# Patient Record
Sex: Female | Born: 1990 | Race: White | Hispanic: Yes | Smoking: Former smoker
Health system: Southern US, Community
[De-identification: ages and names within clinical notes are randomized; demographics above are authoritative.]

## PROBLEM LIST (undated history)

## (undated) DIAGNOSIS — N289 Disorder of kidney and ureter, unspecified: Secondary | ICD-10-CM

## (undated) DIAGNOSIS — Z87442 Personal history of urinary calculi: Secondary | ICD-10-CM

## (undated) HISTORY — PX: WISDOM TOOTH EXTRACTION: SHX21

---
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
## 2012-09-06 ENCOUNTER — Emergency Department: Payer: Self-pay | Admitting: Internal Medicine

## 2012-09-08 ENCOUNTER — Emergency Department: Payer: Self-pay | Admitting: Emergency Medicine

## 2012-09-11 ENCOUNTER — Emergency Department: Payer: Self-pay | Admitting: Emergency Medicine

## 2012-10-30 ENCOUNTER — Emergency Department: Payer: Self-pay | Admitting: Unknown Physician Specialty

## 2014-05-14 ENCOUNTER — Emergency Department: Payer: Self-pay | Admitting: Emergency Medicine

## 2014-12-14 ENCOUNTER — Emergency Department: Payer: Self-pay

## 2014-12-14 ENCOUNTER — Emergency Department
Admission: EM | Admit: 2014-12-14 | Discharge: 2014-12-14 | Disposition: A | Discharge status: Home / Self Care | Payer: Self-pay | Attending: Emergency Medicine | Admitting: Emergency Medicine

## 2014-12-14 ENCOUNTER — Encounter: Payer: Self-pay | Admitting: *Deleted

## 2014-12-14 DIAGNOSIS — R109 Unspecified abdominal pain: Secondary | ICD-10-CM

## 2014-12-14 DIAGNOSIS — N201 Calculus of ureter: Secondary | ICD-10-CM | POA: Insufficient documentation

## 2014-12-14 DIAGNOSIS — Z3202 Encounter for pregnancy test, result negative: Secondary | ICD-10-CM | POA: Insufficient documentation

## 2014-12-14 DIAGNOSIS — Z720 Tobacco use: Secondary | ICD-10-CM | POA: Insufficient documentation

## 2014-12-14 LAB — CBC WITH DIFFERENTIAL/PLATELET
Basophils Absolute: 0.1 10*3/uL (ref 0–0.1)
Basophils Relative: 1 %
EOS ABS: 0.5 10*3/uL (ref 0–0.7)
EOS PCT: 3 %
HCT: 38.3 % (ref 35.0–47.0)
HEMOGLOBIN: 13 g/dL (ref 12.0–16.0)
LYMPHS ABS: 2.3 10*3/uL (ref 1.0–3.6)
LYMPHS PCT: 14 %
MCH: 29.7 pg (ref 26.0–34.0)
MCHC: 33.8 g/dL (ref 32.0–36.0)
MCV: 87.9 fL (ref 80.0–100.0)
MONO ABS: 0.9 10*3/uL (ref 0.2–0.9)
MONOS PCT: 5 %
NEUTROS ABS: 13.2 10*3/uL — AB (ref 1.4–6.5)
Neutrophils Relative %: 77 %
Platelets: 265 10*3/uL (ref 150–440)
RBC: 4.36 MIL/uL (ref 3.80–5.20)
RDW: 13.2 % (ref 11.5–14.5)
WBC: 17.1 10*3/uL — ABNORMAL HIGH (ref 3.6–11.0)

## 2014-12-14 LAB — COMPREHENSIVE METABOLIC PANEL
ALT: 26 U/L (ref 14–54)
ANION GAP: 13 (ref 5–15)
AST: 31 U/L (ref 15–41)
Albumin: 4 g/dL (ref 3.5–5.0)
Alkaline Phosphatase: 63 U/L (ref 38–126)
BILIRUBIN TOTAL: 0.7 mg/dL (ref 0.3–1.2)
BUN: 9 mg/dL (ref 6–20)
CO2: 22 mmol/L (ref 22–32)
Calcium: 8.8 mg/dL — ABNORMAL LOW (ref 8.9–10.3)
Chloride: 100 mmol/L — ABNORMAL LOW (ref 101–111)
Creatinine, Ser: 0.89 mg/dL (ref 0.44–1.00)
GFR calc Af Amer: >60 mL/min (ref >60)
GFR calc non Af Amer: >60 mL/min (ref >60)
GLUCOSE: 116 mg/dL — AB (ref 65–99)
Potassium: 3.8 mmol/L (ref 3.5–5.1)
SODIUM: 135 mmol/L (ref 135–145)
TOTAL PROTEIN: 7.4 g/dL (ref 6.5–8.1)

## 2014-12-14 LAB — URINALYSIS COMPLETE WITH MICROSCOPIC (ARMC ONLY)
Bacteria, UA: NONE SEEN
Bilirubin Urine: NEGATIVE
GLUCOSE, UA: NEGATIVE mg/dL
KETONES UR: NEGATIVE mg/dL
Leukocytes, UA: NEGATIVE
Nitrite: NEGATIVE
PH: 5 (ref 5.0–8.0)
PROTEIN: NEGATIVE mg/dL
SPECIFIC GRAVITY, URINE: 1.016 (ref 1.005–1.030)

## 2014-12-14 LAB — POCT PREGNANCY, URINE: PREG TEST UR: NEGATIVE

## 2014-12-14 MED ORDER — MORPHINE SULFATE 4 MG/ML IJ SOLN: 4.0000 mg | Freq: Once | INTRAMUSCULAR | Status: DC

## 2014-12-14 MED ORDER — ONDANSETRON HCL 4 MG/2ML IJ SOLN
4.0000 mg | Freq: Once | INTRAMUSCULAR | Status: AC
  Administered 2014-12-14: 4 mg via INTRAVENOUS
  Filled 2014-12-14: qty 2

## 2014-12-14 MED ORDER — MORPHINE SULFATE 4 MG/ML IJ SOLN
4.0000 mg | Freq: Once | INTRAMUSCULAR | Status: AC
Start: 2014-12-14 — End: 2014-12-14
  Administered 2014-12-14: 4 mg via INTRAVENOUS
  Filled 2014-12-14: qty 1

## 2014-12-14 MED ORDER — TAMSULOSIN HCL 0.4 MG PO CAPS
0.4000 mg | ORAL_CAPSULE | Freq: Once | ORAL | Status: AC
  Administered 2014-12-14: 0.4 mg via ORAL
  Filled 2014-12-14: qty 1

## 2014-12-14 MED ORDER — TAMSULOSIN HCL 0.4 MG PO CAPS: 0.4000 mg | ORAL_CAPSULE | Freq: Every day | ORAL | Status: DC

## 2014-12-14 MED ORDER — KETOROLAC TROMETHAMINE 30 MG/ML IJ SOLN
INTRAMUSCULAR | Status: AC
  Filled 2014-12-14: qty 1

## 2014-12-14 MED ORDER — SODIUM CHLORIDE 0.9 % IV BOLUS (SEPSIS)
1000.0000 mL | Freq: Once | INTRAVENOUS | Status: AC
  Administered 2014-12-14: 1000 mL via INTRAVENOUS

## 2014-12-14 MED ORDER — OXYCODONE-ACETAMINOPHEN 5-325 MG PO TABS: 1.0000 | ORAL_TABLET | Freq: Four times a day (QID) | ORAL | Status: DC | PRN

## 2014-12-14 MED ORDER — KETOROLAC TROMETHAMINE 30 MG/ML IJ SOLN
30.0000 mg | Freq: Once | INTRAMUSCULAR | Status: AC
  Administered 2014-12-14: 30 mg via INTRAVENOUS
  Filled 2014-12-14: qty 1

## 2014-12-14 NOTE — ED Notes (Signed)
Pt has right flank pain since 1700 today.  No n/v/d.  No hx of kidney stones.

## 2014-12-14 NOTE — ED Provider Notes (Signed)
[facility] Emergency Department Provider Note  ____________________________________________  Time seen: Approximately 710 PM  I have reviewed the triage vital signs and the nursing notes.   HISTORY  Chief Complaint Flank Pain    HPI Rachael Mendez is a 24 y.o. female with a history of chlamydia as well as UTI presents today with 1 day of dark urine, urinary frequency as well as right flank pain. She denies any history of kidney stones or known history in her family. Is also concerned about STDs. Has a history of chlamydia and was treated in the past. Denies any vaginal bleeding or discharge at this time. Says also with headache which is moderate and diffuse.Gradual onset over the last 3-4 days. Also suprapubic abdominal pain. Nauseous but no vomiting.   No past medical history on file.  There are no active problems to display for this patient.   No past surgical history on file.  No current outpatient prescriptions on file.  Allergies Review of patient's allergies indicates no known allergies.  No family history on file.  Social History History  Substance Use Topics  . Smoking status: Current Every Day Smoker  . Smokeless tobacco: Not on file  . Alcohol Use: No    Review of Systems Constitutional: No fever/chills Eyes: No visual changes. ENT: No sore throat. Cardiovascular: Denies chest pain. Respiratory: Denies shortness of breath. Gastrointestinal:  no vomiting.  No diarrhea.  No constipation. Genitourinary: As above  Musculoskeletal: Negative for back pain. Skin: Negative for rash. Neurological: Negative for focal weakness or numbness.  10-point ROS otherwise negative.  ____________________________________________   PHYSICAL EXAM:  VITAL SIGNS: ED Triage Vitals  Enc Vitals Group     BP 12/14/14 1839 112/61 mmHg     Pulse Rate 12/14/14 1839 60     Resp 12/14/14 1839 20     Temp 12/14/14 1839 98.1 F (36.7 C)     Temp Source  12/14/14 1839 Oral     SpO2 12/14/14 1842 97 %     Weight 12/14/14 1839 154 lb (69.854 kg)     Height 12/14/14 1839  (1.549 m)     Head Cir --      Peak Flow --      Pain Score 12/14/14 1841 10     Pain Loc --      Pain Edu? --      Excl. in GC? --     Constitutional: Alert and oriented. Well appearing and in no acute distress. Eyes: Conjunctivae are normal. PERRL. EOMI. Head: Atraumatic. Nose: No congestion/rhinnorhea. Mouth/Throat: Mucous membranes are moist.  Oropharynx non-erythematous. Neck: No stridor.   Cardiovascular: Normal rate, regular rhythm. Grossly normal heart sounds.  Good peripheral circulation. Respiratory: Normal respiratory effort.  No retractions. Lungs CTAB. Gastrointestinal: Soft with mild suprapubic tenderness. No distention. No abdominal bruits. Right CVA tenderness to palpation.  Musculoskeletal: No lower extremity tenderness nor edema.  No joint effusions. Neurologic:  Normal speech and language. No gross focal neurologic deficits are appreciated. No gait instability. Skin:  Skin is warm, dry and intact. No rash noted. Psychiatric: Mood and affect are normal. Speech and behavior are normal.  ____________________________________________   LABS (all labs ordered are listed, but only abnormal results are displayed)  Labs Reviewed  URINALYSIS COMPLETEWITH MICROSCOPIC ([facility] ONLY) - Abnormal; Notable for the following:    Color, Urine YELLOW (*)    APPearance CLEAR (*)    Hgb urine dipstick 1+ (*)    Squamous Epithelial /  LPF 6-30 (*)    All other components within normal limits  CBC WITH DIFFERENTIAL/PLATELET - Abnormal; Notable for the following:    WBC 17.1 (*)    Neutro Abs 13.2 (*)    All other components within normal limits  COMPREHENSIVE METABOLIC PANEL - Abnormal; Notable for the following:    Chloride 100 (*)    Glucose, Bld 116 (*)    Calcium 8.8 (*)    All other components within normal limits  WET PREP, GENITAL  CHLAMYDIA/NGC RT  PCR ([facility] ONLY)  URINE CULTURE  POCT PREGNANCY, URINE   ____________________________________________  EKG   ____________________________________________  RADIOLOGY  2 mm calculus in the right UVJ with moderate Hydro. Nonobstructing calculi in the mid right kidney. ____________________________________________   PROCEDURES   ____________________________________________   INITIAL IMPRESSION / ASSESSMENT AND PLAN / ED COURSE  Pertinent labs & imaging results that were available during my care of the patient were reviewed by me and considered in my medical decision making (see chart for details).  ----------------------------------------- 10:19 PM on 12/14/2014 -----------------------------------------  Patient dosed morphine and then Toradol because of pain not being relieved with morphine. Patient now resting comfortably. We'll discharge to home. Will follow up with urology. We'll give urine strainer for home use.  Discussed case with Dr.Escridge of urology who feels that the patient is appropriate for home as long as the pain is controlled. Reviewed the images as well as lab results.  Patient wanting to defer pelvic exam. I agree with this as the patient has been in discomfort and is likely secondary to the kidney stone which was found on the CAT scan. Will follow-up with her primary care doctor for further evaluation for sexually transmitted diseases. ____________________________________________   FINAL CLINICAL IMPRESSION(S) / ED DIAGNOSES  Right ureterolithiasis. Initial visit.    Myrna Blazer, MD 12/14/14 2220

## 2014-12-14 NOTE — ED Notes (Signed)
Pt complains of right sided flank pain since this AM, pt states she noticed some dark urine, pt also complains of a headache, pt alert in no distress

## 2014-12-16 LAB — URINE CULTURE

## 2015-04-05 ENCOUNTER — Encounter: Payer: Self-pay | Admitting: Emergency Medicine

## 2015-04-05 ENCOUNTER — Emergency Department
Admission: EM | Admit: 2015-04-05 | Discharge: 2015-04-05 | Disposition: A | Discharge status: Home / Self Care | Payer: Self-pay | Attending: Emergency Medicine | Admitting: Emergency Medicine

## 2015-04-05 DIAGNOSIS — N200 Calculus of kidney: Secondary | ICD-10-CM | POA: Insufficient documentation

## 2015-04-05 DIAGNOSIS — Z3202 Encounter for pregnancy test, result negative: Secondary | ICD-10-CM | POA: Insufficient documentation

## 2015-04-05 DIAGNOSIS — F17200 Nicotine dependence, unspecified, uncomplicated: Secondary | ICD-10-CM | POA: Insufficient documentation

## 2015-04-05 HISTORY — DX: Disorder of kidney and ureter, unspecified: N28.9

## 2015-04-05 LAB — COMPREHENSIVE METABOLIC PANEL
ALBUMIN: 4.4 g/dL (ref 3.5–5.0)
ALT: 17 U/L (ref 14–54)
AST: 20 U/L (ref 15–41)
Alkaline Phosphatase: 65 U/L (ref 38–126)
Anion gap: 8 (ref 5–15)
BUN: 8 mg/dL (ref 6–20)
CHLORIDE: 104 mmol/L (ref 101–111)
CO2: 28 mmol/L (ref 22–32)
CREATININE: 0.82 mg/dL (ref 0.44–1.00)
Calcium: 9.4 mg/dL (ref 8.9–10.3)
GFR calc Af Amer: >60 mL/min (ref >60)
GFR calc non Af Amer: >60 mL/min (ref >60)
GLUCOSE: 105 mg/dL — AB (ref 65–99)
Potassium: 4 mmol/L (ref 3.5–5.1)
SODIUM: 140 mmol/L (ref 135–145)
Total Bilirubin: 0.4 mg/dL (ref 0.3–1.2)
Total Protein: 7.8 g/dL (ref 6.5–8.1)

## 2015-04-05 LAB — CBC WITH DIFFERENTIAL/PLATELET
Basophils Absolute: 0.1 10*3/uL (ref 0–0.1)
Basophils Relative: 1 %
EOS PCT: 1 %
Eosinophils Absolute: 0.2 10*3/uL (ref 0–0.7)
HCT: 40.1 % (ref 35.0–47.0)
Hemoglobin: 13.1 g/dL (ref 12.0–16.0)
LYMPHS ABS: 1.2 10*3/uL (ref 1.0–3.6)
LYMPHS PCT: 7 %
MCH: 28.3 pg (ref 26.0–34.0)
MCHC: 32.7 g/dL (ref 32.0–36.0)
MCV: 86.4 fL (ref 80.0–100.0)
MONOS PCT: 8 %
Monocytes Absolute: 1.3 10*3/uL — ABNORMAL HIGH (ref 0.2–0.9)
Neutro Abs: 14.4 10*3/uL — ABNORMAL HIGH (ref 1.4–6.5)
Neutrophils Relative %: 83 %
Platelets: 211 10*3/uL (ref 150–440)
RBC: 4.64 MIL/uL (ref 3.80–5.20)
RDW: 13.2 % (ref 11.5–14.5)
WBC: 17.2 10*3/uL — ABNORMAL HIGH (ref 3.6–11.0)

## 2015-04-05 LAB — LIPASE, BLOOD: Lipase: 23 U/L (ref 11–51)

## 2015-04-05 LAB — URINALYSIS COMPLETE WITH MICROSCOPIC (ARMC ONLY)
Bacteria, UA: NONE SEEN
Bilirubin Urine: NEGATIVE
GLUCOSE, UA: NEGATIVE mg/dL
NITRITE: NEGATIVE
Protein, ur: 100 mg/dL — AB
SPECIFIC GRAVITY, URINE: 1.019 (ref 1.005–1.030)
pH: 7 (ref 5.0–8.0)

## 2015-04-05 LAB — PREGNANCY, URINE: Preg Test, Ur: NEGATIVE

## 2015-04-05 MED ORDER — SODIUM CHLORIDE 0.9 % IV BOLUS (SEPSIS)
1000.0000 mL | Freq: Once | INTRAVENOUS | Status: AC
  Administered 2015-04-05: 1000 mL via INTRAVENOUS

## 2015-04-05 MED ORDER — OXYCODONE-ACETAMINOPHEN 5-325 MG PO TABS: 1.0000 | ORAL_TABLET | Freq: Four times a day (QID) | ORAL | Status: DC | PRN

## 2015-04-05 MED ORDER — ONDANSETRON HCL 4 MG/2ML IJ SOLN
4.0000 mg | Freq: Once | INTRAMUSCULAR | Status: AC
  Administered 2015-04-05: 4 mg via INTRAVENOUS

## 2015-04-05 MED ORDER — CIPROFLOXACIN HCL 500 MG PO TABS
500.0000 mg | ORAL_TABLET | Freq: Two times a day (BID) | ORAL | Status: AC
Start: 2015-04-05 — End: 2015-04-15

## 2015-04-05 MED ORDER — TAMSULOSIN HCL 0.4 MG PO CAPS: 0.4000 mg | ORAL_CAPSULE | Freq: Every day | ORAL | Status: DC

## 2015-04-05 MED ORDER — MORPHINE SULFATE (PF) 4 MG/ML IV SOLN
INTRAVENOUS | Status: AC
  Filled 2015-04-05: qty 1

## 2015-04-05 MED ORDER — PROMETHAZINE HCL 12.5 MG PO TABS: 12.5000 mg | ORAL_TABLET | Freq: Four times a day (QID) | ORAL | Status: DC | PRN

## 2015-04-05 MED ORDER — MORPHINE SULFATE (PF) 4 MG/ML IV SOLN
4.0000 mg | Freq: Once | INTRAVENOUS | Status: AC
  Administered 2015-04-05: 4 mg via INTRAVENOUS

## 2015-04-05 MED ORDER — KETOROLAC TROMETHAMINE 30 MG/ML IJ SOLN
30.0000 mg | Freq: Once | INTRAMUSCULAR | Status: AC
  Administered 2015-04-05: 30 mg via INTRAVENOUS

## 2015-04-05 MED ORDER — ONDANSETRON HCL 4 MG/2ML IJ SOLN
INTRAMUSCULAR | Status: AC
  Filled 2015-04-05: qty 2

## 2015-04-05 NOTE — ED Provider Notes (Addendum)
[facility] Emergency Department Provider Note  ____________________________________________   I have reviewed the triage vital signs and the nursing notes.   HISTORY  Chief Complaint Flank Pain    HPI Rachael Mendez is a 24 y.o. female patient with a history of kidney stones present with sudden onset right flank pain 2 days ago with attendant vomiting and no fever. No dysuria no urinary frequency. Feels like prior kidney stone.  Past Medical History  Diagnosis Date  . Renal disorder     There are no active problems to display for this patient.   History reviewed. No pertinent past surgical history.  Current Outpatient Rx  Name  Route  Sig  Dispense  Refill  . oxyCODONE-acetaminophen (ROXICET) 5-325 MG per tablet   Oral   Take 1-2 tablets by mouth every 6 (six) hours as needed.   15 tablet   0   . tamsulosin (FLOMAX) 0.4 MG CAPS capsule   Oral   Take 1 capsule (0.4 mg total) by mouth daily.   30 capsule   0     Allergies Review of patient's allergies indicates no known allergies.  No family history on file.  Social History Social History  Substance Use Topics  . Smoking status: Current Every Day Smoker  . Smokeless tobacco: None  . Alcohol Use: No    Review of Systems Constitutional: No fever/chills Eyes: No visual changes. ENT: No sore throat. No stiff neck no neck pain Cardiovascular: Denies chest pain. Respiratory: Denies shortness of breath. Gastrointestinal:   Positive vomiting.  No diarrhea.  No constipation. Genitourinary: Negative for dysuria. Musculoskeletal: Negative lower extremity swelling Skin: Negative for rash. Neurological: Negative for headaches, focal weakness or numbness. 10-point ROS otherwise negative.  ____________________________________________   PHYSICAL EXAM:  VITAL SIGNS: ED Triage Vitals  Enc Vitals Group     BP 04/05/15 1636 115/77 mmHg     Pulse Rate 04/05/15 1636 75     Resp 04/05/15  1636 20     Temp 04/05/15 1636 97.8 F (36.6 C)     Temp src --      SpO2 04/05/15 1636 96 %     Weight 04/05/15 1636 154 lb (69.854 kg)     Height 04/05/15 1636  (1.549 m)     Head Cir --      Peak Flow --      Pain Score 04/05/15 1637 9     Pain Loc --      Pain Edu? --      Excl. in GC? --     Constitutional: Alert and oriented. Well appearing ears uncomfortable and nontoxic Eyes: Conjunctivae are normal. PERRL. EOMI. Head: Atraumatic. Nose: No congestion/rhinnorhea. Mouth/Throat: Mucous membranes are moist.  Oropharynx non-erythematous. Neck: No stridor.   Nontender with no meningismus Cardiovascular: Normal rate, regular rhythm. Grossly normal heart sounds.  Good peripheral circulation. Respiratory: Normal respiratory effort.  No retractions. Lungs CTAB. Abdominal: Soft and nontender. No distention. No guarding no rebound Back:  There is no focal tenderness or step off there is no midline tenderness there are no lesions noted. there is right CVA tenderness Musculoskeletal: No lower extremity tenderness. No joint effusions, no DVT signs strong distal pulses no edema Neurologic:  Normal speech and language. No gross focal neurologic deficits are appreciated.  Skin:  Skin is warm, dry and intact. No rash noted. Psychiatric: Mood and affect are normal. Speech and behavior are normal.  ____________________________________________   LABS (all labs ordered are  listed, but only abnormal results are displayed)  Labs Reviewed  URINE CULTURE  URINALYSIS COMPLETEWITH MICROSCOPIC ([facility] ONLY)  PREGNANCY, URINE  COMPREHENSIVE METABOLIC PANEL  CBC WITH DIFFERENTIAL/PLATELET  LIPASE, BLOOD   ____________________________________________  EKG  I personally interpreted any EKGs ordered by me or triage  ____________________________________________  RADIOLOGY  I reviewed any imaging ordered by me or triage that were performed during my  shift ____________________________________________   PROCEDURES  Procedure(s) performed: None  Critical Care performed: None  ____________________________________________   INITIAL IMPRESSION / ASSESSMENT AND PLAN / ED COURSE  Pertinent labs & imaging results that were available during my care of the patient were reviewed by me and considered in my medical decision making (see chart for details).  Patient with known kidney stones and recent CT scan which showed a kidney stone presents today with sudden onset flank pain and vomiting for 2 days ago. We'll check basic blood work. We'll check urinalysis giving IV fluid and antinausea medicine. We'll give pain medicines as well. We will defer CT scan if possible given radiation burn on this young patient with prior CT scan for known kidney stones.   ----------------------------------------- 7:37 PM on 04/05/2015 -----------------------------------------  Patient feels much better nothing at this time to indicate acute UTI, patient has had no dysuria no urinary frequency. Her white count is up and it was up last time she had a kidney stone and her last CT scan showed that there were more to come. Recent and I discussed the risks benefits alternatives of CT scan and she prefer not to have one. I do not think this is unreasonable given that she has a known history of kidney stones. I will send a urine culture as a precaution however there is no other infectious symptoms noted in patient does have known kidney stones with sudden onset flank pain is certainly consistent with that. However, whenever there is sufficient blood in the urine it is difficult to tease out whether there is a urinary tract infection or not so I will cover her with Cipro pending cultures.  ____________________________________________   FINAL CLINICAL IMPRESSION(S) / ED DIAGNOSES  Final diagnoses:  None     Jeanmarie PlantJames A Carson Bogden, MD 04/05/15 1840  Jeanmarie PlantJames A Jhace Fennell,  MD 04/05/15 1937  Jeanmarie PlantJames A Meigan Pates, MD 04/05/15 [phone]

## 2015-04-05 NOTE — Discharge Instructions (Signed)
Clculos renales (Kidney Stones) Los clculos renales (urolitiasis) son masas slidas que se forman en el interior de los riones. El dolor intenso es causado por el movimiento de la piedra a travs del tracto urinario. Cuando la piedra se mueve, el urter hace un espasmo alrededor de la misma. El clculo generalmente se elimina con la orina.  CAUSAS   Un trastorno que hace que ciertas glndulas del cuello produzcan demasiada hormona paratiroidea (hiperparatiroidismo primario).  Una acumulacin de cristales de cido rico, similar a la gota en las articulaciones.  Estrechamiento (constriccin) del urter.  Obstruccin en el rin presente al nacer (obstruccin congnita).  Cirugas previas del rin o los urteres.  Numerosas infecciones renales. SNTOMAS   Ganas de vomitar (nuseas).  Devolver la comida (vomitar).  Sangre en la orina (hematuria).  Dolor que generalmente se expande (irradia) hacia la ingle.  Ganas de orinar con frecuencia o de manera urgente. DIAGNSTICO   Historia clnica y examen fsico.  Anlisis de sangre y orina.  Tomografa computada.  En algunos casos se realiza un examen del interior de la vejiga (citoscopa). TRATAMIENTO   Observacin.  Aumentar la ingesta de lquidos.  Litotricia extracorprea con ondas de choque: es un procedimiento no invasivo que utiliza ondas de choque para romper los clculos renales.  Ser necesaria la ciruga si tiene dolor muy intenso o la obstruccin persiste. Hay varios procedimientos quirrgicos. La mayora de los procedimientos se realizan con el uso de pequeos instrumentos. Slo es necesario realizar pequeas incisiones para acomodar estos instrumentos, por lo tanto el tiempo de recuperacin es mnimo. El tamao, la ubicacin y la composicin qumica de los clculos son variables importantes que determinarn la eleccin correcta de tratamiento para su caso. Comunquese con su mdico para comprender mejor su  situacin, de modo que pueda minimizar los riesgos de lesiones para usted y su rin.  INSTRUCCIONES PARA EL CUIDADO EN EL HOGAR   Beba gran cantidad de lquido para mantener la orina de tono claro o color amarillo plido. Esto ayudar a eliminar las piedras o los fragmentos.  Cuele la orina con el colador que le han provisto. Guarde todas las partculas y piedras para que las vea el profesional que lo asiste. Puede ser tan pequea como un grano de sal. Es muy importante usar el colador cada vez que orine. La recoleccin de piedras permitir al mdico analizar y verificar que efectivamente ha eliminado una piedra. El anlisis de la piedra con frecuencia permitir identificar qu puede hacer para reducir la incidencia de las recurrencias.  Slo tome medicamentos de venta libre o recetados para calmar el dolor, el malestar o bajar la fiebre, segn las indicaciones de su mdico.  Concurra a todas las visitas de control como se lo haya indicado el mdico. Esto es importante.  Si se lo indica, hgase radiografas. La ausencia de dolor no siempre significa que las piedras se han eliminado. Puede ser que simplemente hayan dejado de moverse. Si el paso de orina permanece completamente obstruido, puede causar prdida de la funcin renal o simplemente la destruccin del rin. Es su responsabilidad completar el seguimiento y las radiografas. Las ecografas del rin pueden mostrar una obstruccin y el estado del rin. Las ecografas no se asocian con la radiacin y pueden realizarse fcilmente en cuestin de minutos.  Haga cambios en la dieta diaria como se lo haya indicado el mdico. Es posible que le indiquen lo siguiente:  Limitar la cantidad de sal que consume.  Consumir 5 o ms porciones de frutas   y verduras por da.  Limitar la cantidad de carne, carne de ave, pescado y huevos que consume.  Recoger una muestra de orina durante 24 horas como se lo haya indicado el mdico. Tal vez tenga que recoger  otra muestra de orina cada 6 o 12 meses. SOLICITE ATENCIN MDICA SI:  Siente dolor que no responde a los analgsicos que le recetaron. SOLICITE ATENCIN MDICA DE INMEDIATO SI:   No puede controlar el dolor con los medicamentos que le han recetado.  Siente escalofros o fiebre.  La gravedad o la intensidad del dolor aumenta durante 18 horas y no se alivia con los analgsicos.  Presenta un nuevo episodio de dolor abdominal.  Sufre mareos o se desmaya.  No puede orinar.   Esta informacin no tiene como fin reemplazar el consejo del mdico. Asegrese de hacerle al mdico cualquier pregunta que tenga.   Document Released: 04/23/2005 Document Revised: 01/12/2015 Elsevier Interactive Patient Education 2016 [employer] Inc.  

## 2015-04-05 NOTE — ED Notes (Signed)
Developed flank pain with n/v  Hx of kidney stone

## 2015-04-09 LAB — URINE CULTURE: Culture: 80000

## 2017-04-08 IMAGING — CT CT RENAL STONE PROTOCOL
1 of 2 series · 15 of 32 positions shown, 19 images · non-contrast
Comparison: None.

CLINICAL DATA: Right flank pain for 1 day

EXAM:
CT ABDOMEN AND PELVIS WITHOUT CONTRAST
TECHNIQUE: Multidetector CT imaging of the abdomen and pelvis was performed
following the standard protocol without oral or intravenous contrast
material administration.

[Series 2: stone standard full · axial · 0.70mm/px · z∈[-678,-288]mm · 15 of 86 slices shown, 19 images]
[im 4/86  soft-tissue]
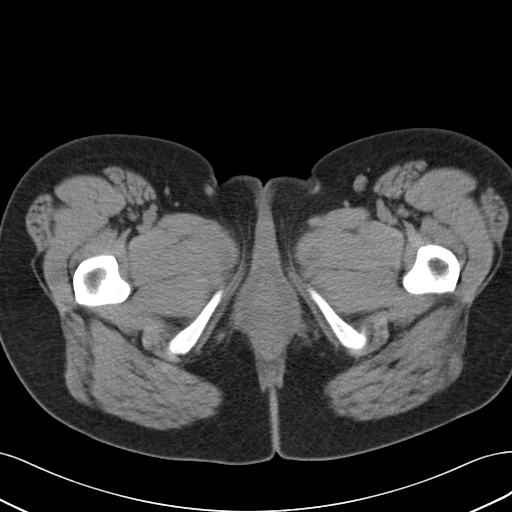
[im 4/86  bone]
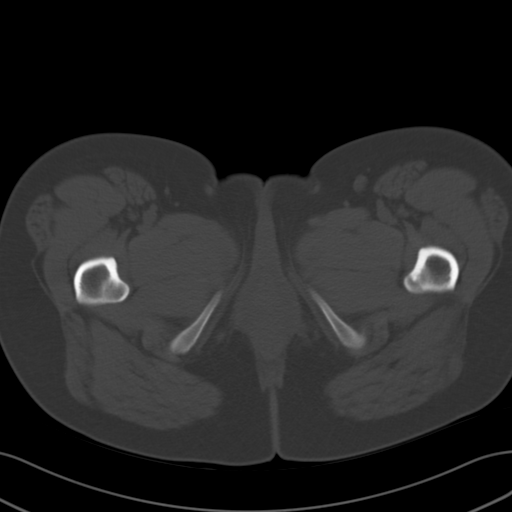
[im 11/86  soft-tissue]
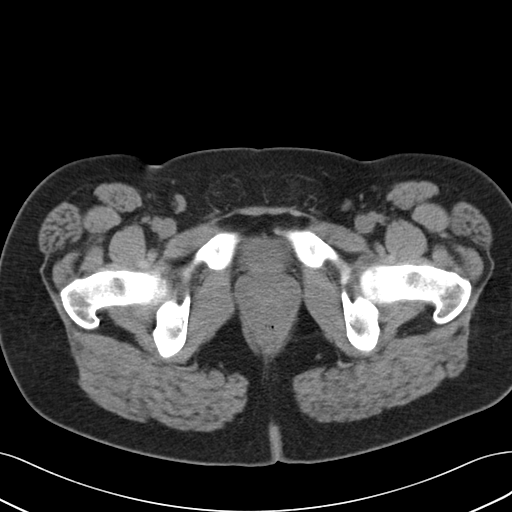
[im 18/86  soft-tissue]
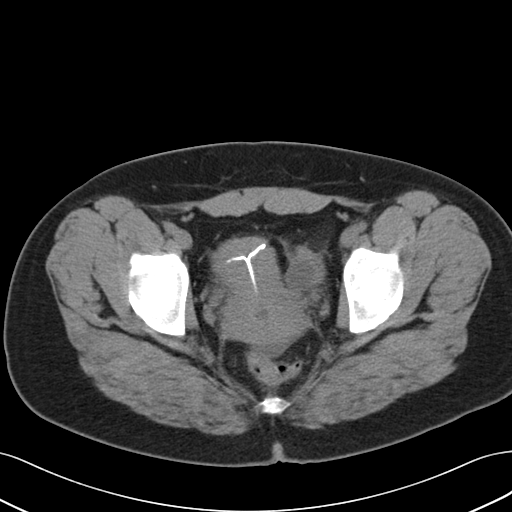
[im 25/86  soft-tissue]
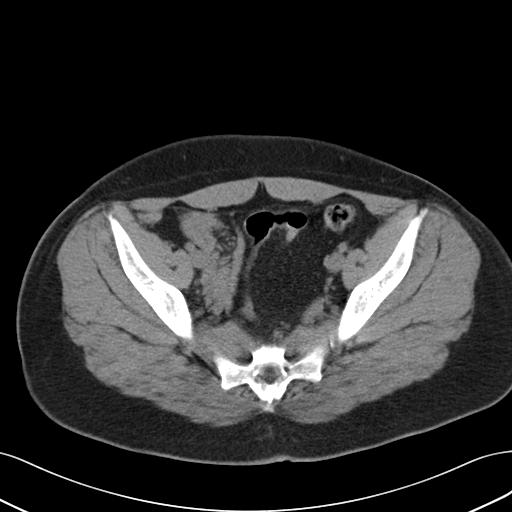
[im 29/86  soft-tissue]
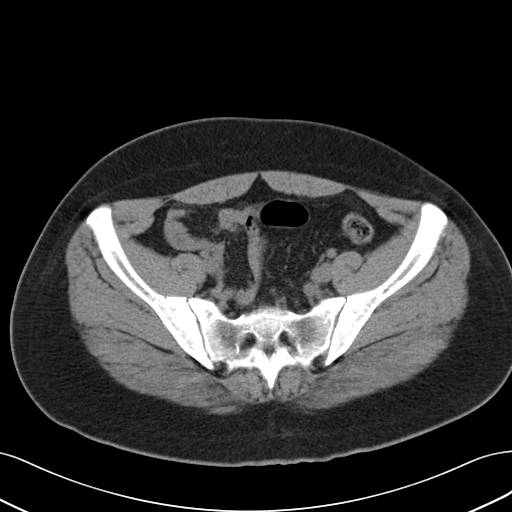
[im 36/86  soft-tissue]
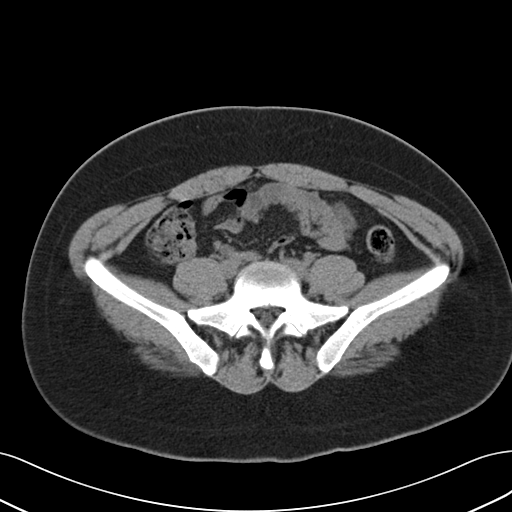
[im 43/86  soft-tissue]
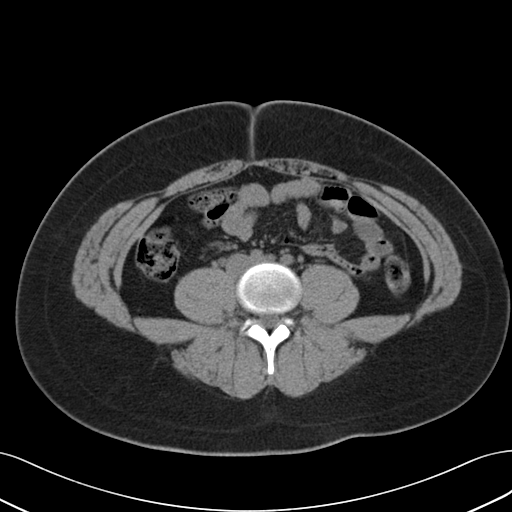
[im 50/86  soft-tissue]
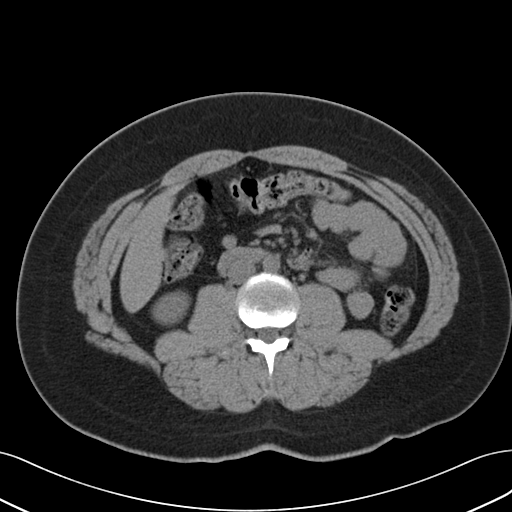
[im 57/86  soft-tissue]
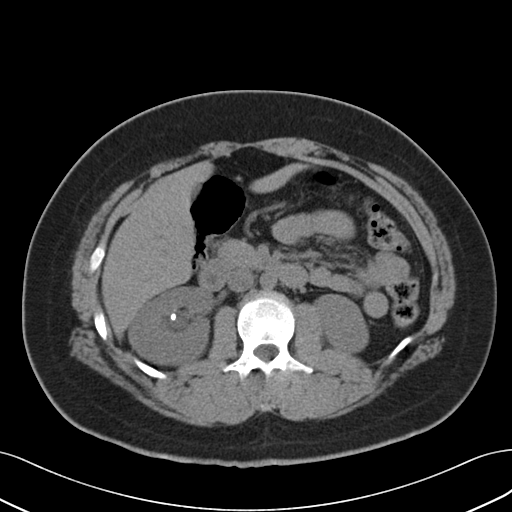
[im 57/86  bone]
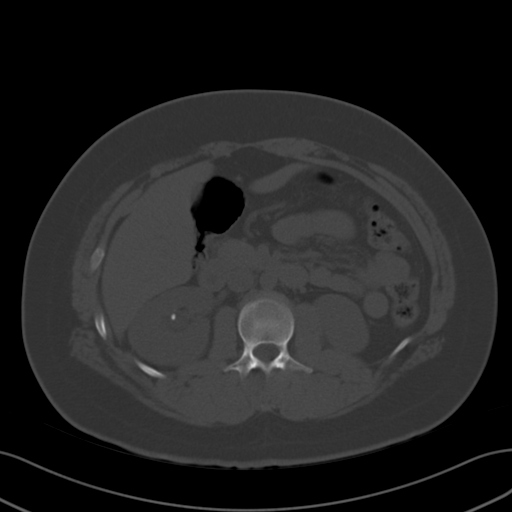
[im 61/86  soft-tissue]
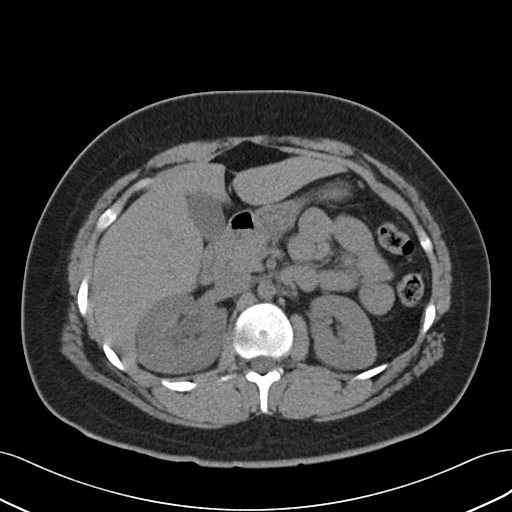
[im 68/86  soft-tissue]
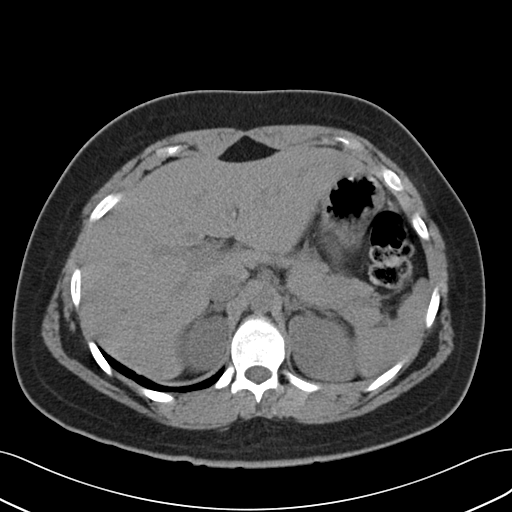
[im 71/86  lung]
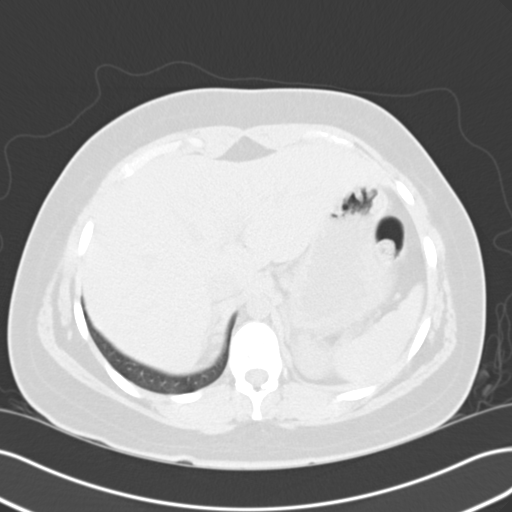
[im 75/86  soft-tissue]
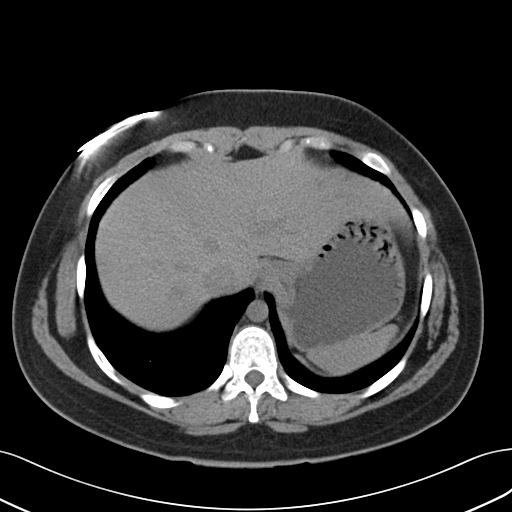
[im 75/86  lung]
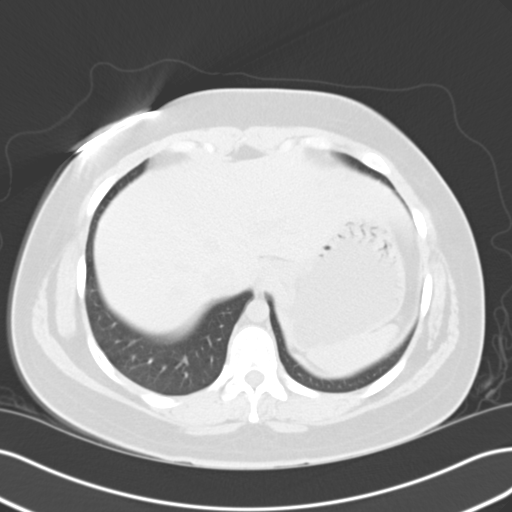
[im 78/86  lung]
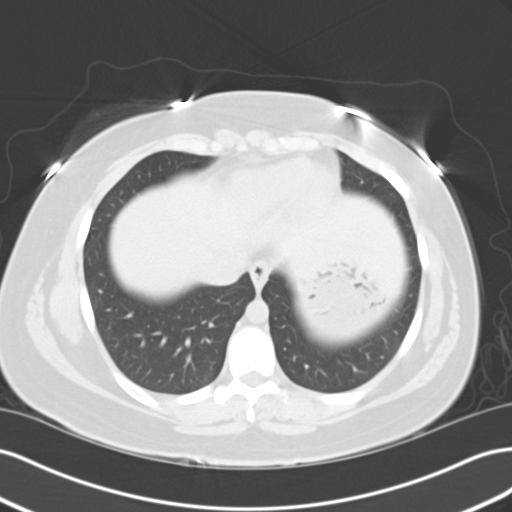
[im 82/86  soft-tissue]
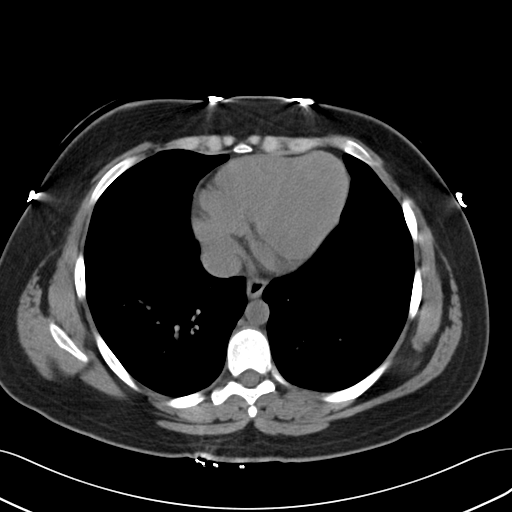
[im 82/86  lung]
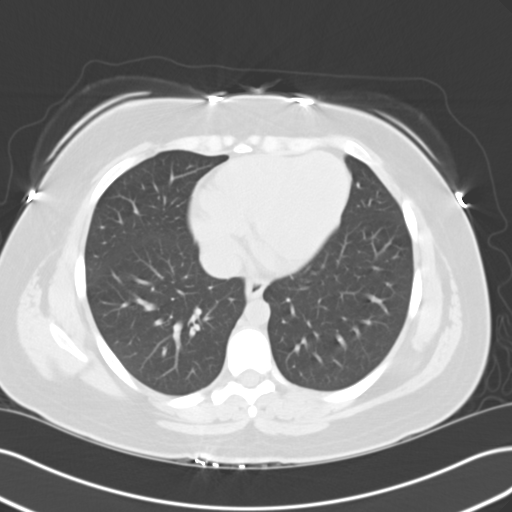

[15 of 32 positions shown; findings below may reference images not displayed]

FINDINGS: Lung bases are clear.

No focal liver lesions are identified on this noncontrast enhanced
study. Gallbladder wall is not thickened. There is no biliary duct
dilatation.

Spleen, pancreas, and adrenals appear normal.

Left kidney shows no evidence of mass or hydronephrosis. There is no
left-sided renal or ureteral calculus. On the right, there is
moderate hydronephrosis. There is no right renal mass. There is a
calculus in the mid right kidney measuring 4 x 3 mm, nonobstructing.
There is a tiny adjacent calculus in the mid right kidney. There is
a 2 mm calculus at the right ureterovesical junction. No other
ureteral calculi are identified.

In the pelvis, the urinary bladder is largely decompressed. The wall
of the urinary bladder is not thickened. There is an intrauterine
device within the uterus. There is a dominant follicle in the left
ovary measuring 2.0 x 1.7 cm. Beyond this physiologic follicle,
there is no pelvic mass. Minimal free fluid in the pelvis may be
physiologic.

Appendix appears normal.

There is no bowel obstruction. No free air or portal venous air.
There is a minimal ventral hernia containing only fat.

There is no adenopathy or abscess in the abdomen or pelvis. Aorta
appears unremarkable. There are no blastic or lytic bone lesions.
IMPRESSION: 2 mm calculus right ureterovesical junction with moderate
hydronephrosis on the right. There are nonobstructing calculi in the
mid right kidney.

Intrauterine device within the uterus. Minimal free fluid in the
pelvis may be physiologic.

No bowel obstruction. No abscess. Appendix appears normal. Minimal
ventral hernia containing only fat.

## 2023-08-28 ENCOUNTER — Encounter: Payer: Self-pay | Admitting: Licensed Practical Nurse

## 2023-08-28 ENCOUNTER — Ambulatory Visit (INDEPENDENT_AMBULATORY_CARE_PROVIDER_SITE_OTHER): Payer: Self-pay | Admitting: Licensed Practical Nurse

## 2023-08-28 VITALS — BP 136/86 | HR 77 | Ht 61.0 in | Wt 176.3 lb

## 2023-08-28 DIAGNOSIS — Z3200 Encounter for pregnancy test, result unknown: Secondary | ICD-10-CM

## 2023-08-28 DIAGNOSIS — Z3201 Encounter for pregnancy test, result positive: Secondary | ICD-10-CM

## 2023-08-28 DIAGNOSIS — N912 Amenorrhea, unspecified: Secondary | ICD-10-CM

## 2023-08-28 LAB — POCT URINE PREGNANCY: Preg Test, Ur: POSITIVE — AB

## 2023-08-28 NOTE — Progress Notes (Signed)
 Pregnancy Confirmation Visit  SUBJECTIVE  Rachael Mendez is a 33 y.o. No obstetric history on file.  who presents for evaluation of amenorrhea and possible pregnancy. Patient's last menstrual period was 07/13/2023 (exact date). and was Normal. She reports monthly but slightly irregular  Pregnancy is desired. Current symptoms include breast tenderness, cramps, mood swings, vaginal irritation, Headache's   Here with Jesus, this pregnancy is not avoided, they are both very excited.   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   Review of Systems Pertinent items are noted in HPI.  OBJECTIVE BP 136/86 (BP Location: Right Arm, Patient Position: Sitting, Cuff Size: Normal)   Pulse 77   Ht 5\' 1"  (1.549 m)   Wt 176 lb 4.8 oz (80 kg)   LMP 07/13/2023 (Exact Date)   BMI 33.31 kg/m  Body mass index is 33.31 kg/m.  alert, well appearing, and in no distress and oriented to person, place, and time  Lab Review Urine hCG: positive   ASSESSMENT Amenorrhea.  Presumed pregnancy at 6wks4days with and EDD of 04/18/2024   PLAN  Encouraged a well-balanced diet, rest, hydration, prenatal vitamins, and walking for exercise. Counseled to avoid alcohol, tobacco, and recreational drugs and to minimize caffeine intake. Safe medications list given. Discussed non-pharmacologic relief measures for nausea. Oriented to [facility]. Genetic screening options were discussed.  A dating US  was ordered. Return in 4wks for labs, NOB physical, and genetic screening if desired.   Anice Kerbs, CNM  [facility]  08/28/23  11:52 AM

## 2023-09-01 ENCOUNTER — Encounter: Payer: Self-pay | Admitting: Licensed Practical Nurse

## 2023-09-05 ENCOUNTER — Ambulatory Visit: Payer: Self-pay

## 2023-09-05 ENCOUNTER — Other Ambulatory Visit (HOSPITAL_COMMUNITY)
Admission: RE | Admit: 2023-09-05 | Discharge: 2023-09-05 | Disposition: A | Discharge status: Home / Self Care | Payer: Self-pay | Source: Ambulatory Visit | Attending: Certified Nurse Midwife | Admitting: Certified Nurse Midwife

## 2023-09-05 VITALS — BP 111/65 | HR 70 | Ht 61.0 in | Wt 174.0 lb

## 2023-09-05 DIAGNOSIS — N898 Other specified noninflammatory disorders of vagina: Secondary | ICD-10-CM

## 2023-09-05 NOTE — Progress Notes (Signed)
    NURSE VISIT NOTE  Subjective:    Patient ID: Rachael Mendez, female    DOB: November 07, 1990, 33 y.o.   MRN: [id number]  HPI  Patient is a 33 y.o. G1P0 female who presents for vaginal odor with no other symptom for a couple days now.  Objective:    BP 111/65   Pulse 70   Ht 5\' 1"  (1.549 m)   Wt 174 lb (78.9 kg)   LMP 07/13/2023 (Exact Date)   BMI 32.88 kg/m      Assessment:   1. Vaginal odor       Plan:   GC and chlamydia DNA  probe sent to lab. Treatment: abstain from coitus during course of treatment ROV prn if symptoms persist or worsen.Has new ob apts set up.   Rachael Mendez, CMA

## 2023-09-05 NOTE — Progress Notes (Signed)
 New OB Intake  I connected with  Rachael Mendez on 09/06/23 at 11:15 AM EDT by MyChart Video Visit and verified that I am speaking with the correct person using two identifiers. Nurse is located at [facility] and pt is located at home.  I discussed the limitations, risks, security and privacy concerns of performing an evaluation and management service by telephone and the availability of in person appointments. I also discussed with the patient that there may be a patient responsible charge related to this service. The patient expressed understanding and agreed to proceed.  I explained I am completing New OB Intake today. We discussed her EDD of 04/18/24 that is based on LMP of 07/13/23. Pt is G1/P0. I reviewed her allergies, medications, Medical/Surgical/OB history, and appropriate screenings. There are no cats in the home. Based on history, this is a/an pregnancy uncomplicated . Her obstetrical history is significant for  N/A .  Patient Active Problem List   Diagnosis Date Noted   Supervision of other normal pregnancy, antepartum 09/06/2023   History of herpes genitalis 09/06/2023    Concerns addressed today: None  Delivery Plans:  Plans to deliver at [facility].  Anatomy US  Explained first scheduled US  will be soon. Anatomy US  will be done at 20 weeks of gestational age.  Labs Discussed genetic screening with patient. Patient desires genetic testing to be drawn at new OB visit. Discussed possible labs to be drawn at new OB appointment.  COVID Vaccine Patient has had 2 COVID vaccines.   Social Determinants of Health Food Insecurity: denies food insecurity Transportation: Patient denies transportation needs.  First visit review I reviewed new OB appt with pt. I explained she will have blood work and pap smear/pelvic exam if indicated. Explained pt will be seen by Alise Appl, CNM at first visit; encounter routed to appropriate provider.   Higinio Love,  [facility] 09/06/2023  11:51 AM

## 2023-09-06 ENCOUNTER — Telehealth (INDEPENDENT_AMBULATORY_CARE_PROVIDER_SITE_OTHER): Payer: Self-pay

## 2023-09-06 DIAGNOSIS — Z3480 Encounter for supervision of other normal pregnancy, unspecified trimester: Secondary | ICD-10-CM | POA: Insufficient documentation

## 2023-09-06 DIAGNOSIS — Z3687 Encounter for antenatal screening for uncertain dates: Secondary | ICD-10-CM

## 2023-09-06 DIAGNOSIS — Z3689 Encounter for other specified antenatal screening: Secondary | ICD-10-CM

## 2023-09-06 DIAGNOSIS — Z8619 Personal history of other infectious and parasitic diseases: Secondary | ICD-10-CM | POA: Insufficient documentation

## 2023-09-06 LAB — CERVICOVAGINAL ANCILLARY ONLY
Bacterial Vaginitis (gardnerella): POSITIVE — AB
Candida Glabrata: NEGATIVE
Candida Vaginitis: POSITIVE — AB
Chlamydia: NEGATIVE
Comment: NEGATIVE
Comment: NEGATIVE
Comment: NEGATIVE
Comment: NEGATIVE
Comment: NEGATIVE
Comment: NORMAL
Neisseria Gonorrhea: NEGATIVE
Trichomonas: NEGATIVE

## 2023-09-06 NOTE — Patient Instructions (Signed)
 First Trimester of Pregnancy  The first trimester of pregnancy starts on the first day of your last monthly period until the end of week 13. This is months 1 through 3 of pregnancy. A week after a sperm fertilizes an egg, the egg will implant into the wall of the uterus and begin to develop into a baby. Body changes during your first trimester Your body goes through many changes during pregnancy. The changes usually return to normal after your baby is born. Physical changes Your breasts may grow larger and may hurt. The area around your nipples may get darker. Your periods will stop. Your hair and nails may grow faster. You may pee more often. Health changes You may tire easily. Your gums may bleed and may be sensitive when you brush and floss. You may not feel hungry. You may have heartburn. You may throw up or feel like you may throw up. You may want to eat some foods, but not others. You may have headaches. You may have trouble pooping (constipation). Other changes Your emotions may change from day to day. You may have more dreams. Follow these instructions at home: Medicines Talk to your health care provider if you're taking medicines. Ask if the medicines are safe to take during pregnancy. Your provider may change the medicines that you take. Do not take any medicines unless told to by your provider. Take a prenatal vitamin that has at least 600 micrograms (mcg) of folic acid. Do not use herbal medicines, illegal substances, or medicines that are not approved by your provider. Eating and drinking While you're pregnant your body needs extra food for your growing baby. Talk with your provider about what to eat while pregnant. Activity Most women are able to exercise during pregnancy. Exercises may need to change as your pregnancy goes on. Talk to your provider about your activities and exercise routines. Relieving pain and discomfort Wear a good, supportive bra if your breasts  hurt. Rest with your legs raised if you have leg cramps or low back pain. Safety Wear your seatbelt at all times when you're in a car. Talk to your provider if someone hits you, hurts you, or yells at you. Talk with your provider if you're feeling sad or have thoughts of hurting yourself. Lifestyle Certain things can be harmful while you're pregnant. Follow these rules: Do not use hot tubs, steam rooms, or saunas. Do not douche. Do not use tampons or scented pads. Do not drink alcohol,smoke, vape, or use products with nicotine or tobacco in them. If you need help quitting, talk with your provider. Avoid cat litter boxes and soil used by cats. These things carry germs that can cause harm to your pregnancy and your baby. General instructions Keep all follow-up visits. It helps you and your unborn baby stay as healthy as possible. Write down your questions. Take them to your visits. Your provider will: Talk with you about your overall health. Give you advice or refer you to specialists who can help with different needs, including: Prenatal education classes. Mental health and counseling. Foods and healthy eating. Ask for help if you need help with food. Call your dentist and ask to be seen. Brush your teeth with a soft toothbrush. Floss gently. Where to find more information American Pregnancy Association: americanpregnancy.org [employer]ians [employer]necologists: acog.org Office on [employer] Health: [employer] Contact a health care provider if: You feel dizzy, faint, or have a fever. You vomit or have watery poop (diarrhea) for 2  days or more. You have abnormal discharge or bleeding from your vagina. You have pain when you pee or your pee smells bad. You have cramps, pain, or pressure in your belly area. Get help right away if: You have trouble breathing or chest pain. You have any kind of injury, such as from a fall or a car crash. These symptoms may be an  emergency. Get help right away. Call 911. Do not wait to see if the symptoms will go away. Do not drive yourself to the hospital. This information is not intended to replace advice given to you by your health care provider. Make sure you discuss any questions you have with your health care provider. Document Revised: 01/24/2023 Document Reviewed: 08/24/2022 Elsevier Patient Education  2024 Elsevier Inc.Commonly Asked Questions During Pregnancy  Cats: A parasite can be excreted in cat feces.  To avoid exposure you need to have another person empty the little box.  If you must empty the litter box you will need to wear gloves.  Wash your hands after handling your cat.  This parasite can also be found in raw or undercooked meat so this should also be avoided.  Colds, Sore Throats, Flu: Please check your medication sheet to see what you can take for symptoms.  If your symptoms are unrelieved by these medications please call the office.  Dental Work: Most any dental work Agricultural consultant recommends is permitted.  X-rays should only be taken during the first trimester if absolutely necessary.  Your abdomen should be shielded with a lead apron during all x-rays.  Please notify your provider prior to receiving any x-rays.  Novocaine is fine; gas is not recommended.  If your dentist requires a note from [place] prior to dental work please call the office and we will provide one for you.  Exercise: Exercise is an important part of staying healthy during your pregnancy.  You may continue most exercises you were accustomed to prior to pregnancy.  Later in your pregnancy you will most likely notice you have difficulty with activities requiring balance like riding a bicycle.  It is important that you listen to your body and avoid activities that put you at a higher risk of falling.  Adequate rest and staying well hydrated are a must!  If you have questions about the safety of specific activities ask your provider.     Exposure to Children with illness: Try to avoid obvious exposure; report any symptoms to [place] when noted,  If you have chicken pos, red measles or mumps, you should be immune to these diseases.   Please do not take any vaccines while pregnant unless you have checked with your OB provider.  Fetal Movement: After 28 weeks we recommend you do "kick counts" twice daily.  Lie or sit down in a calm quiet environment and count your baby movements "kicks".  You should feel your baby at least 10 times per hour.  If you have not felt 10 kicks within the first hour get up, walk around and have something sweet to eat or drink then repeat for an additional hour.  If count remains less than 10 per hour notify your provider.  Fumigating: Follow your pest control agent's advice as to how long to stay out of your home.  Ventilate the area well before re-entering.  Hemorrhoids:   Most over-the-counter preparations can be used during pregnancy.  Check your medication to see what is safe to use.  It is important to use a  stool softener or fiber in your diet and to drink lots of liquids.  If hemorrhoids seem to be getting worse please call the office.   Hot Tubs:  Hot tubs Jacuzzis and saunas are not recommended while pregnant.  These increase your internal body temperature and should be avoided.  Intercourse:  Sexual intercourse is safe during pregnancy as long as you are comfortable, unless otherwise advised by your provider.  Spotting may occur after intercourse; report any bright red bleeding that is heavier than spotting.  Labor:  If you know that you are in labor, please go to the hospital.  If you are unsure, please call the office and let us help you decide what to do.  Lifting, straining, etc:  If your job requires heavy lifting or straining please check with your provider for any limitations.  Generally, you should not lift items heavier than that you can lift simply with your hands and arms (no back  muscles)  Painting:  Paint fumes do not harm your pregnancy, but may make you ill and should be avoided if possible.  Latex or water based paints have less odor than oils.  Use adequate ventilation while painting.  Permanents & Hair Color:  Chemicals in hair dyes are not recommended as they cause increase hair dryness which can increase hair loss during pregnancy.  " Highlighting" and permanents are allowed.  Dye may be absorbed differently and permanents may not hold as well during pregnancy.  Sunbathing:  Use a sunscreen, as skin burns easily during pregnancy.  Drink plenty of fluids; avoid over heating.  Tanning Beds:  Because their possible side effects are still unknown, tanning beds are not recommended.  Ultrasound Scans:  Routine ultrasounds are performed at approximately 20 weeks.  You will be able to see your baby's general anatomy an if you would like to know the gender this can usually be determined as well.  If it is questionable when you conceived you may also receive an ultrasound early in your pregnancy for dating purposes.  Otherwise ultrasound exams are not routinely performed unless there is a medical necessity.  Although you can request a scan we ask that you pay for it when conducted because insurance does not cover " patient request" scans.  Work: If your pregnancy proceeds without complications you may work until your due date, unless your physician or employer advises otherwise.  Round Ligament Pain/Pelvic Discomfort:  Sharp, shooting pains not associated with bleeding are fairly common, usually occurring in the second trimester of pregnancy.  They tend to be worse when standing up or when you remain standing for long periods of time.  These are the result of pressure of certain pelvic ligaments called "round ligaments".  Rest, Tylenol and heat seem to be the most effective relief.  As the womb and fetus grow, they rise out of the pelvis and the discomfort improves.  Please  notify the office if your pain seems different than that described.  It may represent a more serious condition.  Common Medications Safe in Pregnancy  Acne:      Constipation:  Benzoyl Peroxide     Colace  Clindamycin      Dulcolax Suppository  Topica Erythromycin     Fibercon  Salicylic Acid      Metamucil         Miralax AVOID:        Senakot   Accutane    Cough:  Retin-A       Cough  Drops  Tetracycline      Phenergan w/ Codeine if Rx  Minocycline      Robitussin (Plain & DM)  Antibiotics:     Crabs/Lice:  Ceclor       RID  Cephalosporins    AVOID:  E-Mycins      Kwell  Keflex  Macrobid/Macrodantin   Diarrhea:  Penicillin      Kao-Pectate  Zithromax      Imodium AD         PUSH FLUIDS AVOID:       Cipro     Fever:  Tetracycline      Tylenol (Regular or Extra  Minocycline       Strength)  Levaquin      Extra Strength-Do not          Exceed 8 tabs/24 hrs Caffeine:        200mg /day (equiv. To 1 cup of coffee or  approx. 3 12 oz sodas)         Gas: Cold/Hayfever:       Gas-X  Benadryl      Mylicon  Claritin       Phazyme  **Claritin-D        Chlor-Trimeton    Headaches:  Dimetapp      ASA-Free Excedrin  Drixoral-Non-Drowsy     Cold Compress  Mucinex (Guaifenasin)     Tylenol (Regular or Extra  Sudafed/Sudafed-12 Hour     Strength)  **Sudafed PE Pseudoephedrine   Tylenol Cold & Sinus     Vicks Vapor Rub  Zyrtec  **AVOID if Problems With Blood Pressure         Heartburn: Avoid lying down for at least 1 hour after meals  Aciphex      Maalox     Rash:  Milk of Magnesia     Benadryl    Mylanta       1% Hydrocortisone Cream  Pepcid  Pepcid Complete   Sleep Aids:  Prevacid      Ambien   Prilosec       Benadryl  Rolaids       Chamomile Tea  Tums (Limit 4/day)     Unisom         Tylenol PM         Warm milk-add vanilla or  Hemorrhoids:       Sugar for taste  Anusol/Anusol H.C.  (RX: Analapram 2.5%)  Sugar Substitutes:  Hydrocortisone OTC     Ok in  moderation  Preparation H      Tucks        Vaseline lotion applied to tissue with wiping    Herpes:     Throat:  Acyclovir      Oragel  Famvir  Valtrex     Vaccines:         Flu Shot Leg Cramps:       *Gardasil  Benadryl      Hepatitis A         Hepatitis B Nasal Spray:       Pneumovax  Saline Nasal Spray     Polio Booster         Tetanus Nausea:       Tuberculosis test or PPD  Vitamin B6 25 mg TID   AVOID:    Dramamine      *Gardasil  Emetrol       Live Poliovirus  Ginger Root 250 mg QID    MMR (measles, mumps &  High  Complex Carbs @ Bedtime    rebella)  Sea Bands-Accupressure    Varicella (Chickenpox)  Unisom 1/2 tab TID     *No known complications           If received before Pain:         Known pregnancy;   Darvocet       Resume series after  Lortab        Delivery  Percocet    Yeast:   Tramadol      Femstat  Tylenol 3      Gyne-lotrimin  Ultram       Monistat  Vicodin           MISC:         All Sunscreens           Hair Coloring/highlights          Insect Repellant's          (Including DEET)         Mystic Tans

## 2023-09-09 ENCOUNTER — Encounter: Payer: Self-pay | Admitting: Licensed Practical Nurse

## 2023-09-09 ENCOUNTER — Other Ambulatory Visit: Payer: Self-pay

## 2023-09-09 MED ORDER — METRONIDAZOLE 500 MG PO TABS: 500.0000 mg | ORAL_TABLET | Freq: Two times a day (BID) | ORAL | 0 refills | Status: DC

## 2023-09-09 MED ORDER — FLUCONAZOLE 150 MG PO TABS: 150.0000 mg | ORAL_TABLET | Freq: Every day | ORAL | 0 refills | Status: DC

## 2023-09-10 ENCOUNTER — Encounter: Payer: Self-pay | Admitting: Licensed Practical Nurse

## 2023-09-11 ENCOUNTER — Ambulatory Visit (INDEPENDENT_AMBULATORY_CARE_PROVIDER_SITE_OTHER): Payer: Self-pay | Admitting: Licensed Practical Nurse

## 2023-09-11 ENCOUNTER — Other Ambulatory Visit: Payer: Self-pay

## 2023-09-11 ENCOUNTER — Ambulatory Visit
Admission: RE | Admit: 2023-09-11 | Discharge: 2023-09-11 | Disposition: A | Discharge status: Home / Self Care | Payer: Self-pay | Source: Ambulatory Visit | Attending: Certified Nurse Midwife | Admitting: Certified Nurse Midwife

## 2023-09-11 VITALS — BP 123/81 | HR 84 | Wt 172.6 lb

## 2023-09-11 DIAGNOSIS — Z3A00 Weeks of gestation of pregnancy not specified: Secondary | ICD-10-CM

## 2023-09-11 DIAGNOSIS — O4690 Antepartum hemorrhage, unspecified, unspecified trimester: Secondary | ICD-10-CM

## 2023-09-11 DIAGNOSIS — Z3687 Encounter for antenatal screening for uncertain dates: Secondary | ICD-10-CM | POA: Insufficient documentation
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
## 2023-09-12 ENCOUNTER — Encounter: Payer: Self-pay | Admitting: Licensed Practical Nurse

## 2023-09-12 LAB — ABO AND RH: Rh Factor: POSITIVE

## 2023-09-13 ENCOUNTER — Ambulatory Visit: Payer: Self-pay

## 2023-09-17 ENCOUNTER — Telehealth: Payer: Self-pay | Admitting: Licensed Practical Nurse

## 2023-09-17 DIAGNOSIS — O200 Threatened abortion: Secondary | ICD-10-CM

## 2023-09-17 NOTE — Telephone Encounter (Signed)
 TC to Ayliana  Reviewed US  finding Probable early intrauterine gestational sac, but no yolk sac, fetal pole, or cardiac activity yet visualized. Recommend follow-up quantitative B-HCG levels and follow-up US  in 14 days to assess viability.  Has had some bleeding with wiping daily. Had IC last night had spotting afterwards.  Feeling bloated, has a little cramping but nothing severe or consistent.  Will repeat Us  in 2 weeks US  ordered Anice Kerbs, CNM  [facility]  09/17/2023 5:28 PM

## 2023-09-17 NOTE — Telephone Encounter (Signed)
 Pt called LVM to review US  Anice Kerbs, CNM  [facility]  09/17/2023 4:41 PM

## 2023-09-19 ENCOUNTER — Encounter: Payer: Self-pay | Admitting: Licensed Practical Nurse

## 2023-09-19 ENCOUNTER — Ambulatory Visit (INDEPENDENT_AMBULATORY_CARE_PROVIDER_SITE_OTHER): Admitting: Licensed Practical Nurse

## 2023-09-19 VITALS — BP 125/81 | HR 73 | Ht 61.0 in | Wt 168.7 lb

## 2023-09-19 DIAGNOSIS — O039 Complete or unspecified spontaneous abortion without complication: Secondary | ICD-10-CM | POA: Diagnosis not present

## 2023-09-19 DIAGNOSIS — Z3A00 Weeks of gestation of pregnancy not specified: Secondary | ICD-10-CM
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
## 2023-09-20 LAB — BETA HCG QUANT (REF LAB): hCG Quant: 1613 m[IU]/mL

## 2023-09-23 ENCOUNTER — Ambulatory Visit: Payer: Self-pay | Admitting: Licensed Practical Nurse

## 2023-10-03 ENCOUNTER — Ambulatory Visit (INDEPENDENT_AMBULATORY_CARE_PROVIDER_SITE_OTHER): Admitting: Licensed Practical Nurse

## 2023-10-03 ENCOUNTER — Encounter: Payer: Self-pay | Admitting: Licensed Practical Nurse

## 2023-10-03 VITALS — BP 114/76 | HR 69 | Wt 174.6 lb

## 2023-10-03 DIAGNOSIS — O039 Complete or unspecified spontaneous abortion without complication: Secondary | ICD-10-CM | POA: Diagnosis not present

## 2023-10-03 DIAGNOSIS — Z3A01 Less than 8 weeks gestation of pregnancy: Secondary | ICD-10-CM | POA: Diagnosis not present
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
## 2023-10-04 ENCOUNTER — Ambulatory Visit: Payer: Self-pay | Admitting: Licensed Practical Nurse

## 2023-10-04 LAB — BETA HCG QUANT (REF LAB): hCG Quant: 7 m[IU]/mL

## 2023-10-09 ENCOUNTER — Encounter: Payer: Self-pay | Admitting: Certified Nurse Midwife

## 2023-11-13 ENCOUNTER — Ambulatory Visit: Admitting: Licensed Practical Nurse

## 2024-05-17 ENCOUNTER — Emergency Department: Payer: Self-pay

## 2024-05-17 ENCOUNTER — Emergency Department
Admission: EM | Admit: 2024-05-17 | Discharge: 2024-05-18 | Disposition: A | Discharge status: Home / Self Care | Payer: Self-pay

## 2024-05-17 ENCOUNTER — Encounter: Payer: Self-pay | Admitting: Emergency Medicine

## 2024-05-17 ENCOUNTER — Other Ambulatory Visit: Payer: Self-pay

## 2024-05-17 DIAGNOSIS — T07XXXA Unspecified multiple injuries, initial encounter: Secondary | ICD-10-CM

## 2024-05-17 DIAGNOSIS — S62617A Displaced fracture of proximal phalanx of left little finger, initial encounter for closed fracture: Secondary | ICD-10-CM | POA: Insufficient documentation

## 2024-05-17 DIAGNOSIS — S20111A Abrasion of breast, right breast, initial encounter: Secondary | ICD-10-CM | POA: Insufficient documentation

## 2024-05-17 DIAGNOSIS — Z23 Encounter for immunization: Secondary | ICD-10-CM | POA: Insufficient documentation

## 2024-05-17 MED ORDER — TETANUS-DIPHTH-ACELL PERTUSSIS 5-2-15.5 LF-MCG/0.5 IM SUSP
0.5000 mL | Freq: Once | INTRAMUSCULAR | Status: AC
  Administered 2024-05-18: 0.5 mL via INTRAMUSCULAR
  Filled 2024-05-17: qty 0.5
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
## 2024-05-18 MED ORDER — LIDOCAINE HCL (PF) 1 % IJ SOLN
5.0000 mL | Freq: Once | INTRAMUSCULAR | Status: AC
  Administered 2024-05-18: 5 mL
  Filled 2024-05-18: qty 5

## 2024-05-18 MED ORDER — OXYCODONE-ACETAMINOPHEN 5-325 MG PO TABS
2.0000 | ORAL_TABLET | Freq: Once | ORAL | Status: AC
  Administered 2024-05-18: 2 via ORAL
  Filled 2024-05-18: qty 2

## 2024-05-18 MED ORDER — HYDROCODONE-ACETAMINOPHEN 5-325 MG PO TABS: 2.0000 | ORAL_TABLET | Freq: Four times a day (QID) | ORAL | 0 refills | Status: AC | PRN

## 2024-05-18 MED ORDER — BACITRACIN ZINC 500 UNIT/GM EX OINT
TOPICAL_OINTMENT | CUTANEOUS | Status: AC
  Administered 2024-05-18: 1 via TOPICAL

## 2024-05-18 NOTE — Discharge Instructions (Addendum)
 Keep your wounds clean and dry.  We recommend that you continue using the buddy tape to keep your little finger as straight as possible.  Use over-the-counter ibuprofen and/or Tylenol  as needed. Take Norco as prescribed for severe pain. Do not drink alcohol, drive or participate in any other potentially dangerous activities while taking this medication as it may make you sleepy. Do not take this medication with any other sedating medications, either prescription or over-the-counter. If you were prescribed Percocet or Vicodin, do not take these with acetaminophen  (Tylenol ) as it is already contained within these medications.   This medication is an opiate (or narcotic) pain medication and can be habit forming.  Use it as little as possible to achieve adequate pain control.  Do not use or use it with extreme caution if you have a history of opiate abuse or dependence.  If you are on a pain contract with your primary care doctor or a pain specialist, be sure to let them know you were prescribed this medication today from the [facility] Emergency Department.  This medication is intended for your use only - do not give any to anyone else and keep it in a secure place where nobody else, especially children, have access to it.  It will also cause or worsen constipation, so you may want to consider taking an over-the-counter stool softener while you are taking this medication.  It is very important that you follow-up with a hand specialist at the next available opportunity so that they can plan for how best to help your finger heal.

## 2024-05-19 ENCOUNTER — Other Ambulatory Visit: Payer: Self-pay

## 2024-05-20 ENCOUNTER — Other Ambulatory Visit: Payer: Self-pay

## 2024-05-20 ENCOUNTER — Encounter
Admission: RE | Admit: 2024-05-20 | Discharge: 2024-05-20 | Disposition: A | Discharge status: Home / Self Care | Source: Ambulatory Visit

## 2024-05-20 VITALS — Ht 61.0 in | Wt 170.6 lb

## 2024-05-20 DIAGNOSIS — Z01812 Encounter for preprocedural laboratory examination: Secondary | ICD-10-CM

## 2024-05-20 HISTORY — DX: Personal history of urinary calculi: Z87.442
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
## 2024-05-20 NOTE — Patient Instructions (Addendum)
 Your procedure is scheduled on: THURSDAY  JANUARY 15 Report to the Registration Desk on the 1st floor of the [facility] To find out your arrival time, please call [phone] between 1PM - 3PM on:  [facility]  JANUARY 14  If your arrival time is 6:00 am, do not arrive before that time as the [facility] entrance doors do not open until 6:00 am.                                            YOUR ARRIVAL TIME IS 11:00   REMEMBER: Instructions that are not followed completely may result in serious medical risk, up to and including death; or upon the discretion of your surgeon and anesthesiologist your surgery may need to be rescheduled.  Do not eat food after midnight the night before surgery.  No gum chewing or hard candies.  You may however, drink CLEAR liquids up to 2 hours before you are scheduled to arrive for your surgery. Do not drink anything within 2 hours of your scheduled arrival time.  Clear liquids include: - water  - apple juice without pulp - gatorade (not RED colors) - black coffee or tea (Do NOT add milk or creamers to the coffee or tea) Do NOT drink anything that is not on this list.   One week prior to surgery: Stop Anti-inflammatories (NSAIDS) such as Advil, Aleve, Ibuprofen, Motrin, Naproxen, Naprosyn and Aspirin based products such as Excedrin, Goody's Powder, BC Powder. Stop ANY OVER THE COUNTER supplements until after surgery.  You may however, continue to take Tylenol  if needed for pain up until the day of surgery.  Continue taking all of your other prescription medications up until the day of surgery.  ON THE DAY OF SURGERY ONLY TAKE THESE MEDICATIONS WITH SIPS OF WATER:  HYDROcodone -acetaminophen  if needed for pain  No Alcohol for 24 hours before or after surgery.  Do not use any recreational drugs for at least a week (preferably 2 weeks) before your surgery.  Please be advised that the combination of cocaine and anesthesia may have negative outcomes,  up to and including death. If you test positive for cocaine, your surgery will be cancelled.  On the morning of surgery brush your teeth with toothpaste and water, you may rinse your mouth with mouthwash if you wish. Do not swallow any toothpaste or mouthwash.  Do not wear jewelry, make-up, hairpins, clips or nail polish.  For welded (permanent) jewelry: bracelets, anklets, waist bands, etc.  Please have this removed prior to surgery.  If it is not removed, there is a chance that hospital personnel will need to cut it off on the day of surgery.  Use antibacterial soap the night before your surgery  Do not wear lotions, powders, or perfumes.   Do not shave body hair from the neck down 48 hours before surgery.  Do not bring valuables to the hospital. [facility] is not responsible for any missing/lost belongings or valuables.   Notify your doctor if there is any change in your medical condition (cold, fever, infection).  Wear comfortable clothing (specific to your surgery type) to the hospital.  After surgery, you can help prevent lung complications by doing breathing exercises.  Take deep breaths and cough every 1-2 hours.  If you are being discharged the day of surgery, you will not be allowed to drive home. You  will need a responsible individual to drive you home and stay with you for 24 hours after surgery.   If you are taking public transportation, you will need to have a responsible individual with you.  Please call the Pre-admissions Testing Dept. at [phone] if you have any questions about these instructions.  Surgery Visitation Policy:  Patients having surgery or a procedure may have two visitors.  Children under the age of 33 must have an adult with them who is not the patient.   Merchandiser, Retail to address health-related social needs:  [email or web address]

## 2024-05-21 ENCOUNTER — Ambulatory Visit: Payer: Self-pay | Admitting: Anesthesiology

## 2024-05-21 ENCOUNTER — Other Ambulatory Visit: Payer: Self-pay

## 2024-05-21 ENCOUNTER — Ambulatory Visit: Payer: Self-pay

## 2024-05-21 ENCOUNTER — Ambulatory Visit
Admission: RE | Admit: 2024-05-21 | Discharge: 2024-05-21 | Disposition: A | Discharge status: Home / Self Care | Payer: Self-pay

## 2024-05-21 ENCOUNTER — Encounter: Admission: RE | Disposition: A | Payer: Self-pay | Source: Home / Self Care

## 2024-05-21 DIAGNOSIS — S62617A Displaced fracture of proximal phalanx of left little finger, initial encounter for closed fracture: Secondary | ICD-10-CM | POA: Insufficient documentation

## 2024-05-21 DIAGNOSIS — Z01812 Encounter for preprocedural laboratory examination: Secondary | ICD-10-CM

## 2024-05-21 HISTORY — PX: CLOSED REDUCTION FINGER WITH PERCUTANEOUS PINNING: SHX5612

## 2024-05-21 LAB — POCT PREGNANCY, URINE: Preg Test, Ur: NEGATIVE

## 2024-05-21 MED ORDER — CEFAZOLIN SODIUM-DEXTROSE 2-4 GM/100ML-% IV SOLN
2.0000 g | INTRAVENOUS | Status: AC
  Administered 2024-05-21: 2 g via INTRAVENOUS

## 2024-05-21 MED ORDER — PROPOFOL 10 MG/ML IV BOLUS
INTRAVENOUS | Status: DC | PRN
  Administered 2024-05-21: 120 ug/kg/min via INTRAVENOUS
  Administered 2024-05-21: 70 mg via INTRAVENOUS

## 2024-05-21 MED ORDER — 0.9 % SODIUM CHLORIDE (POUR BTL) OPTIME
TOPICAL | Status: DC | PRN
  Administered 2024-05-21: 500 mL

## 2024-05-21 MED ORDER — FENTANYL CITRATE (PF) 100 MCG/2ML IJ SOLN
INTRAMUSCULAR | Status: DC | PRN
  Administered 2024-05-21: 50 ug via INTRAVENOUS

## 2024-05-21 MED ORDER — MIDAZOLAM HCL (PF) 2 MG/2ML IJ SOLN
1.0000 mg | INTRAMUSCULAR | Status: AC | PRN
  Administered 2024-05-21 (×2): 1 mg via INTRAVENOUS

## 2024-05-21 MED ORDER — CEFAZOLIN SODIUM-DEXTROSE 2-4 GM/100ML-% IV SOLN
INTRAVENOUS | Status: AC
  Filled 2024-05-21: qty 100

## 2024-05-21 MED ORDER — PROPOFOL 1000 MG/100ML IV EMUL
INTRAVENOUS | Status: AC
  Filled 2024-05-21: qty 100

## 2024-05-21 MED ORDER — ONDANSETRON HCL 4 MG/2ML IJ SOLN
INTRAMUSCULAR | Status: AC
  Filled 2024-05-21: qty 2

## 2024-05-21 MED ORDER — LACTATED RINGERS IV SOLN: INTRAVENOUS | Status: DC

## 2024-05-21 MED ORDER — LIDOCAINE HCL (PF) 1 % IJ SOLN
INTRAMUSCULAR | Status: AC
  Filled 2024-05-21: qty 5

## 2024-05-21 MED ORDER — ORAL CARE MOUTH RINSE: 15.0000 mL | Freq: Once | OROMUCOSAL | Status: AC

## 2024-05-21 MED ORDER — ONDANSETRON HCL 4 MG/2ML IJ SOLN
INTRAMUSCULAR | Status: DC | PRN
  Administered 2024-05-21: 4 mg via INTRAVENOUS

## 2024-05-21 MED ORDER — BUPIVACAINE LIPOSOME 1.3 % IJ SUSP
INTRAMUSCULAR | Status: AC
  Filled 2024-05-21: qty 10

## 2024-05-21 MED ORDER — MIDAZOLAM HCL 2 MG/2ML IJ SOLN
INTRAMUSCULAR | Status: AC
  Filled 2024-05-21: qty 2

## 2024-05-21 MED ORDER — CHLORHEXIDINE GLUCONATE 0.12 % MT SOLN
15.0000 mL | Freq: Once | OROMUCOSAL | Status: AC
  Administered 2024-05-21: 15 mL via OROMUCOSAL

## 2024-05-21 MED ORDER — DEXAMETHASONE SOD PHOSPHATE PF 10 MG/ML IJ SOLN
INTRAMUSCULAR | Status: DC | PRN
  Administered 2024-05-21: 10 mg via INTRAVENOUS

## 2024-05-21 MED ORDER — LIDOCAINE HCL (PF) 1 % IJ SOLN
INTRAMUSCULAR | Status: DC | PRN
  Administered 2024-05-21: 3 mL

## 2024-05-21 MED ORDER — OXYCODONE HCL 5 MG PO TABS: 5.0000 mg | ORAL_TABLET | Freq: Three times a day (TID) | ORAL | 0 refills | Status: AC | PRN

## 2024-05-21 MED ORDER — FENTANYL CITRATE (PF) 100 MCG/2ML IJ SOLN
INTRAMUSCULAR | Status: AC
  Filled 2024-05-21: qty 2

## 2024-05-21 MED ORDER — BUPIVACAINE HCL (PF) 0.5 % IJ SOLN
INTRAMUSCULAR | Status: DC | PRN
  Administered 2024-05-21: 10 mL

## 2024-05-21 MED ORDER — LIDOCAINE HCL (PF) 2 % IJ SOLN
INTRAMUSCULAR | Status: DC | PRN
  Administered 2024-05-21: 80 mg

## 2024-05-21 MED ORDER — BUPIVACAINE HCL (PF) 0.5 % IJ SOLN
INTRAMUSCULAR | Status: AC
  Filled 2024-05-21: qty 10

## 2024-05-21 MED ORDER — BUPIVACAINE LIPOSOME 1.3 % IJ SUSP
INTRAMUSCULAR | Status: DC | PRN
  Administered 2024-05-21: 10 mL

## 2024-05-21 MED ORDER — CHLORHEXIDINE GLUCONATE 0.12 % MT SOLN
OROMUCOSAL | Status: AC
  Filled 2024-05-21: qty 15

## 2024-05-21 NOTE — Anesthesia Procedure Notes (Signed)
 Anesthesia Regional Block: Supraclavicular block   Pre-Anesthetic Checklist: , timeout performed,  Correct Patient, Correct Site, Correct Laterality,  Correct Procedure, Correct Position, site marked,  Risks and benefits discussed,  Surgical consent,  Pre-op evaluation,  At surgeon's request and post-op pain management  Laterality: Left  Prep: chloraprep       Needles:  Injection technique: Single-shot  Needle Type: Echogenic Needle     Needle Length: 4cm  Needle Gauge: 25     Additional Needles:   Procedures:,,,, ultrasound used (permanent image in chart),,    Narrative:  Start time: 05/21/2024 11:54 AM End time: 05/21/2024 11:57 AM Injection made incrementally with aspirations every 5 mL.  Performed by: Personally  Anesthesiologist: Chesley Lendia CROME, MD  Additional Notes: Patient's chart reviewed and they were deemed appropriate candidate for procedure, per surgeon's request. Patient educated about risks, benefits, and alternatives of the block including but not limited to: temporary or permanent nerve damage, hemidiaphragmatic paralysis leading to dyspnea, pneumothorax, bleeding, infection, damage to surround tissues, block failure, local anesthetic toxicity. Patient expressed understanding. A formal time-out was conducted consistent with institution rules.  Monitors were applied, and minimal sedation used (see nursing record). The site was prepped with skin prep and allowed to dry, and sterile gloves were used. A high frequency linear ultrasound probe with probe cover was utilized throughout. Supraclavicular artery visualized, along with the brachial plexus adjacent to it and appeared anatomically normal. Local anesthetic injected around the plexus, and echogenic block needle trajectory was monitored throughout. Aspiration performed every 5ml. Lung and blood vessels were avoided. All injections were performed without resistance and free of blood and paresthesias. The patient  tolerated the procedure well.  Injectate: 10cc Exparel  + 10cc 0.5% bupi

## 2024-05-21 NOTE — Discharge Instructions (Signed)
 Discharge Instructions After Surgery   Dressings   Do not remove your dressing. Keep it clean and dry until your follow-up visit.   It is okay to have a small spot of blood on your dressing as long as it does not keep getting bigger.   Cover the dressing and keep it dry in the shower or bath. Keep your arm up and out of the water.   Diet   Return to your regular diet as tolerated.   Activity   If your fingers are free, work on making a full fist and straightening out the fingers within the limits of your splint.  Move other joints such as your elbow or shoulder multiple times a day to prevent getting stiff.  Do not try to move joints that are within a splint.  No lifting, carrying, pushing, or pulling with your arm until your follow-up visit. You may use your hand or fingers for simple things like writing, typing, eating, and brushing your teeth.   If you were given a sling, wear it while your hand or arm is numb. You can stop wearing the sling when you have feeling and strength back in your hand or arm. You may also use the sling as needed to elevate, protect, or support your arm until your follow-up visit.   Pain   The first 48 hours after surgery can hurt the most. You should use the different types of pain medicine along with rest and elevation to help keep the pain manageable.   NSAIDS such as ibuprofen  (Advil  or Motrin ) or naproxen (Aleve) can help with pain and swelling.   Acetaminophen  (Tylenol ) can be used with NSAIDs. Do not take more than 3000 mg of Tylenol  in a 24-hour period.   Narcotic pain medicine such as hydrocodone or oxycodone , should be used as prescribed. Stop taking as soon as possible. Take with food to help prevent nausea. Do not drink alcohol or drive while taking narcotics. Be aware that hydrocodone tablets often already contain acetaminophen  (Tylenol ).  Elevating your operative extremity above the level of your heart will help with pain and swelling. You can  rest your arm on several pillows for support while sleeping or resting.   Constipation   Narcotic pain medicines, changes in eating and drinking, and less activity may cause constipation after surgery.   Over-the-counter stool softeners like docusate (Colace), Senna, or Miralax can help. Follow the directions on the bottle.   Stay hydrated and try to eat high fiber foods.   When to call your surgeon   If your dressing gets wet or very dirty.   Fever more than 101 F.   Incision that is very red, swollen, draining pus, or feels hot.   Severe pain, not getting better with pain medicine.   Severe swelling, even with elevation.   If your fingers become cool, cold, or dark in color.   Bleeding that is getting bigger or soaking through the dressing.   Severe nausea and vomiting.   Problems using the bathroom or no bowel movement for 2-3 days.   For questions or concerns, please call [phone].  Jackquline CANDIE Barrack, MD [facility]

## 2024-05-21 NOTE — Anesthesia Preprocedure Evaluation (Addendum)
"                                    Anesthesia Evaluation  Patient identified by MRN, date of birth, ID band Patient awake    Reviewed: Allergy & Precautions, NPO status , Patient's Chart, lab work & pertinent test results  Airway Mallampati: II  TM Distance: >3 FB Neck ROM: Full    Dental  (+) Teeth Intact   Pulmonary Patient abstained from smoking., former smoker   Pulmonary exam normal        Cardiovascular Exercise Tolerance: Good negative cardio ROS Normal cardiovascular exam Rhythm:Regular Rate:Normal     Neuro/Psych negative neurological ROS  negative psych ROS   GI/Hepatic negative GI ROS, Neg liver ROS,,,  Endo/Other  negative endocrine ROS    Renal/GU negative Renal ROS  negative genitourinary   Musculoskeletal   Abdominal Normal abdominal exam  (+)   Peds negative pediatric ROS (+)  Hematology negative hematology ROS (+)   Anesthesia Other Findings Past Medical History: No date: History of kidney stones  Past Surgical History: No date: WISDOM TOOTH EXTRACTION     Reproductive/Obstetrics negative OB ROS                              Anesthesia Physical Anesthesia Plan  ASA: 2  Anesthesia Plan: General and Regional   Post-op Pain Management:    Induction: Intravenous  PONV Risk Score and Plan: Propofol  infusion and TIVA  Airway Management Planned: Natural Airway and Nasal Cannula  Additional Equipment:   Intra-op Plan:   Post-operative Plan:   Informed Consent: I have reviewed the patients History and Physical, chart, labs and discussed the procedure including the risks, benefits and alternatives for the proposed anesthesia with the patient or authorized representative who has indicated his/her understanding and acceptance.     Dental Advisory Given  Plan Discussed with: CRNA  Anesthesia Plan Comments:          Anesthesia Quick Evaluation  "

## 2024-05-21 NOTE — Op Note (Addendum)
 Date of procedure:  05/21/2024  Surgeon:  Jackquline CANDIE Barrack, MD   Procedure(s) performed:   Closed reduction percutaneous pinning, left little finger proximal phalanx (CPT [phone]) Application of short arm splint (CPT [phone])   Assistant(s): none   Preoperative diagnosis:   Displaced fracture of proximal phalanx of left little finger   Postoperative diagnosis:   Displaced fracture of proximal phalanx of left little finger   Procedure findings:  As above  Anesthesia:  Regional + MAC   Estimated blood loss:  Minimal   Specimen:  None   Complications:  None apparent   Implants: 2x 0.045 K wires   Indications: 34 y.o. year old female with left displaced small finger proximal phalanx fracture .  We discussed operative treatment and explained the risks, benefits, and alternatives as well as the expected postoperative course.  The patient elected to proceed with surgery.   Procedure in detail:  The patient was met in the pre-op holding area and the left upper extremity was marked and the consent confirmed.  Anesthesia placed a short acting regional block.  The patient was then taken to the operating room and the hand table was attached to the stretcher.  Antibiotics were administered prior to incision.  All bony prominences were well padded.  A tourniquet was placed high on the operative extremity. the operative extremity was then prepped and draped in the usual sterile fashion.  A timeout was performed confirming the correct patient, site, and procedure.   An Esmarch was used to exsanguinate the extremity and the tourniquet was insufflated to 250 mmHg.  Fluoroscopy was used to examine the fracture and the fracture was reduced in a closed fashion.  Adequate reduction was confirmed under fluoroscopy.  A 0.045 Kirschner wire was placed in an anterograde fashion from the base of the proximal phalanx into the head of the proximal phalanx.  A second crossing 0.045 Kirschner wire was placed on the  other side of the proximal phalanx base.  Fluoroscopy was again used to confirm adequate reduction and hardware placement. Proper finger rotation and aligment was confirmed through passive ROM of the digit as well as the tenodesis affect. The pins were then bent and cut. A small incision was made at the base of the pins to relax the skin at the pin sites.    Sterile dressings were applied including an ulnar gutter splint.  The tourniquet was let down.  The patient was transferred to the PACU in stable condition.  Counts were correct x2.  Post-Operative Plan: Ulnar gutter splint/cast x 4 weeks Nonweightbearing left upper extremity ROM of other digits Plan for pin removal 4 weeks postoperatively in the office

## 2024-05-21 NOTE — Transfer of Care (Signed)
 Immediate Anesthesia Transfer of Care Note  Patient: Rachael Mendez  Procedure(s) Performed: CLOSED REDUCTION, FINGER, WITH PERCUTANEOUS PINNING (Left: Little Finger)  Patient Location: PACU  Anesthesia Type:General  Level of Consciousness: drowsy and patient cooperative  Airway & Oxygen Therapy: Patient connected to nasal cannula oxygen  Post-op Assessment: Report given to RN and Post -op Vital signs reviewed and stable  Post vital signs: Reviewed and stable  Last Vitals:  Vitals Value Taken Time  BP 112/78 05/21/24 13:18  Temp    Pulse 75 05/21/24 13:23  Resp 16 05/21/24 13:23  SpO2 99 % 05/21/24 13:23  Vitals shown include unfiled device data.  Last Pain:  Vitals:   05/21/24 1200  TempSrc:   PainSc: 0-No pain         Complications: No notable events documented.

## 2024-05-21 NOTE — Interval H&P Note (Signed)
 History and Physical Interval Note:  05/21/2024 11:28 AM  Rachael Mendez  has presented today for surgery, with the diagnosis of Displaced fracture of proximal phalanx of left little finger, initial encounter for closed fracture.  The various methods of treatment have been discussed with the patient and family. After consideration of risks, benefits and other options for treatment, the patient has consented to  Procedures with comments: CLOSED REDUCTION, FINGER, WITH PERCUTANEOUS PINNING (Left) - Left small finger proximal phalanx closed reduction percutaneous pinning with application of short arm splint as a surgical intervention.  The patient's history has been reviewed, patient examined, no change in status, stable for surgery.  I have reviewed the patient's chart and labs.  Questions were answered to the patient's satisfaction.     Jackquline GORMAN Barrack

## 2024-05-22 NOTE — Anesthesia Postprocedure Evaluation (Signed)
"   Anesthesia Post Note  Patient: Rachael Mendez  Procedure(s) Performed: CLOSED REDUCTION, FINGER, WITH PERCUTANEOUS PINNING (Left: Little Finger)  Anesthesia Type: Regional Level of consciousness: awake and awake and alert Pain management: satisfactory to patient Vital Signs Assessment: post-procedure vital signs reviewed and stable Respiratory status: spontaneous breathing Cardiovascular status: stable Anesthetic complications: no   No notable events documented.   Last Vitals:  Vitals:   05/21/24 1415 05/21/24 1423  BP: 111/77 130/87  Pulse: (!) 52 68  Resp: 19 17  Temp: (!) 36.1 C 36.6 C  SpO2: 97% 100%    Last Pain:  Vitals:   05/21/24 1423  TempSrc: Temporal  PainSc: 0-No pain                 VAN STAVEREN,Sereniti Wan      "
